# Patient Record
Sex: Male | Born: 1995 | Race: Asian | Hispanic: No | Marital: Single | Smoking: Never smoker
Health system: Southern US, Community
[De-identification: ages and names within clinical notes are randomized; demographics above are authoritative.]

---

## 2015-06-28 ENCOUNTER — Ambulatory Visit (INDEPENDENT_AMBULATORY_CARE_PROVIDER_SITE_OTHER): Payer: Managed Care, Other (non HMO)

## 2015-06-28 ENCOUNTER — Ambulatory Visit (INDEPENDENT_AMBULATORY_CARE_PROVIDER_SITE_OTHER): Payer: Managed Care, Other (non HMO) | Admitting: Family Medicine

## 2015-06-28 ENCOUNTER — Encounter: Payer: Self-pay | Admitting: Family Medicine

## 2015-06-28 VITALS — BP 116/76 | HR 87 | Temp 99.4°F | Resp 16 | Ht 65.0 in | Wt 131.0 lb

## 2015-06-28 DIAGNOSIS — Z111 Encounter for screening for respiratory tuberculosis: Secondary | ICD-10-CM

## 2015-06-28 DIAGNOSIS — Z119 Encounter for screening for infectious and parasitic diseases, unspecified: Secondary | ICD-10-CM

## 2015-06-28 DIAGNOSIS — Z23 Encounter for immunization: Secondary | ICD-10-CM

## 2015-06-28 NOTE — Patient Instructions (Addendum)
You got your flu shot today.  I will be in touch with your chest x-ray report and your varicella blood test (titer) If you wish, you can set up your mychart account and print your results out at home to save time!    Because you received an x-ray today, you will receive an invoice from [facility]. Please contact [facility] at [phone] with questions or concerns regarding your invoice. Our billing staff will not be able to assist you with those questions.

## 2015-06-28 NOTE — Progress Notes (Signed)
[facility] [address] [place] [address] [id number] [phone]  Date:  06/28/2015   Name:  Chad Zuniga   DOB:  [birth year]   MRN:  [id number]  PCP:  No PCP Per Patient    Chief Complaint: Immunizations; Flu Vaccine; and Other   History of Present Illness:  Chad Zuniga is a 20 y.o. 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   He is [place] and had all of his childhood vacs in [place].  He did not have the chicken pox as a child that he can recall Offered a PPD or CXR for his required PPD screening  He is generally in good health   There are no active problems to display for this patient.   History reviewed. No pertinent past medical history.  History reviewed. No pertinent past surgical history.  Social History  Substance Use Topics  . Smoking status: Never Smoker   . Smokeless tobacco: None  . Alcohol Use: None    History reviewed. No pertinent family history.  No Known Allergies  Medication list has been reviewed and updated.  No current outpatient prescriptions on file prior to visit.   No current facility-administered medications on file prior to visit.    Review of Systems:  As per HPI- otherwise negative.   Physical Examination: Filed Vitals:   06/28/15 1338  BP: 116/76  Pulse: 87  Temp: 99.4 F (37.4 C)  Resp: 16   Filed Vitals:   06/28/15 1338  Height:  (1.651 m)  Weight: 131 lb (59.421 kg)   Body mass index is 21.8 kg/(m^2). Ideal Body Weight: Weight in (lb) to have BMI = 25: 149.9  GEN: WDWN, NAD, Non-toxic, A & O x 3, looks well, slim build HEENT: Atraumatic, Normocephalic. Neck supple. No masses, No LAD. Ears and Nose: No external deformity. CV: RRR, No M/G/R. No JVD. No thrill. No extra heart sounds. PULM: CTA B, no wheezes, crackles, rhonchi. No retractions. No  resp. distress. No accessory muscle use. EXTR: No c/c/e NEURO Normal gait.  PSYCH: Normally interactive. Conversant. Not depressed or anxious appearing.  Calm demeanor.    Assessment and Plan: Screening examination for infectious disease - Plan: Varicella zoster antibody, IgG, DG Chest 1 View  Immunization due - Plan: Flu Vaccine QUAD 36+ mos IM  Screening for tuberculosis - Plan: DG Chest 1 View  Flu shot today, he prefers a CXR for TB screening as he does not have time to RTC for PPD read Varicella ab testing today  Signed Abbe Amsterdam, MD

## 2015-06-29 ENCOUNTER — Encounter: Payer: Self-pay | Admitting: Family Medicine

## 2015-06-29 LAB — VARICELLA ZOSTER ANTIBODY, IGG: VARICELLA IGG: 98.14 {index} (ref <135.00)

## 2015-07-04 ENCOUNTER — Encounter: Payer: Self-pay | Admitting: Family Medicine

## 2015-07-04 ENCOUNTER — Telehealth: Payer: Self-pay

## 2015-07-04 DIAGNOSIS — Z23 Encounter for immunization: Secondary | ICD-10-CM

## 2015-07-04 NOTE — Telephone Encounter (Signed)
Pt is looking for lab results   [phone]

## 2015-07-05 ENCOUNTER — Telehealth: Payer: Self-pay | Admitting: *Deleted

## 2015-07-05 MED ORDER — VARICELLA VIRUS VACCINE LIVE 1350 PFU/0.5ML IJ SUSR: 0.5000 mL | Freq: Once | INTRAMUSCULAR | Status: AC

## 2015-07-05 NOTE — Telephone Encounter (Signed)
Done

## 2015-07-05 NOTE — Telephone Encounter (Signed)
Pt.notified

## 2015-07-05 NOTE — Telephone Encounter (Signed)
From   Fanny Dance    To   Pearline Cables, MD    Sent   07/04/2015 12:50 PM        Hi Dr. Patsy Lager,  I have a Varicella titer done last week, & you left me a phone message that titer was negative. Can you please provide me a prescription of Varicella vaccine, and send it to [facility] in [place]: [address] [place], [place] [address]  [phone]. I called the pharmacy this morning and told them I will get my vaccine as soon as prescription is received..    Thank you so much!  P.S. Test result of my Varicella titer is not uploaded in [facility] records.         Can we help him?

## 2015-07-07 MED ORDER — VARICELLA VIRUS VACCINE LIVE 1350 PFU/0.5ML IJ SUSR: 0.5000 mL | Freq: Once | INTRAMUSCULAR | Status: AC

## 2015-07-07 NOTE — Addendum Note (Signed)
Addended by: Abbe Amsterdam C on: 07/07/2015 03:16 PM   Modules accepted: Orders

## 2017-05-06 IMAGING — CR DG CHEST 1V
1 series · 1 of 1 positions shown · non-contrast
Comparison: None.

CLINICAL DATA: Screening for infectious disease

EXAM:
CHEST 1 VIEW

[PA]
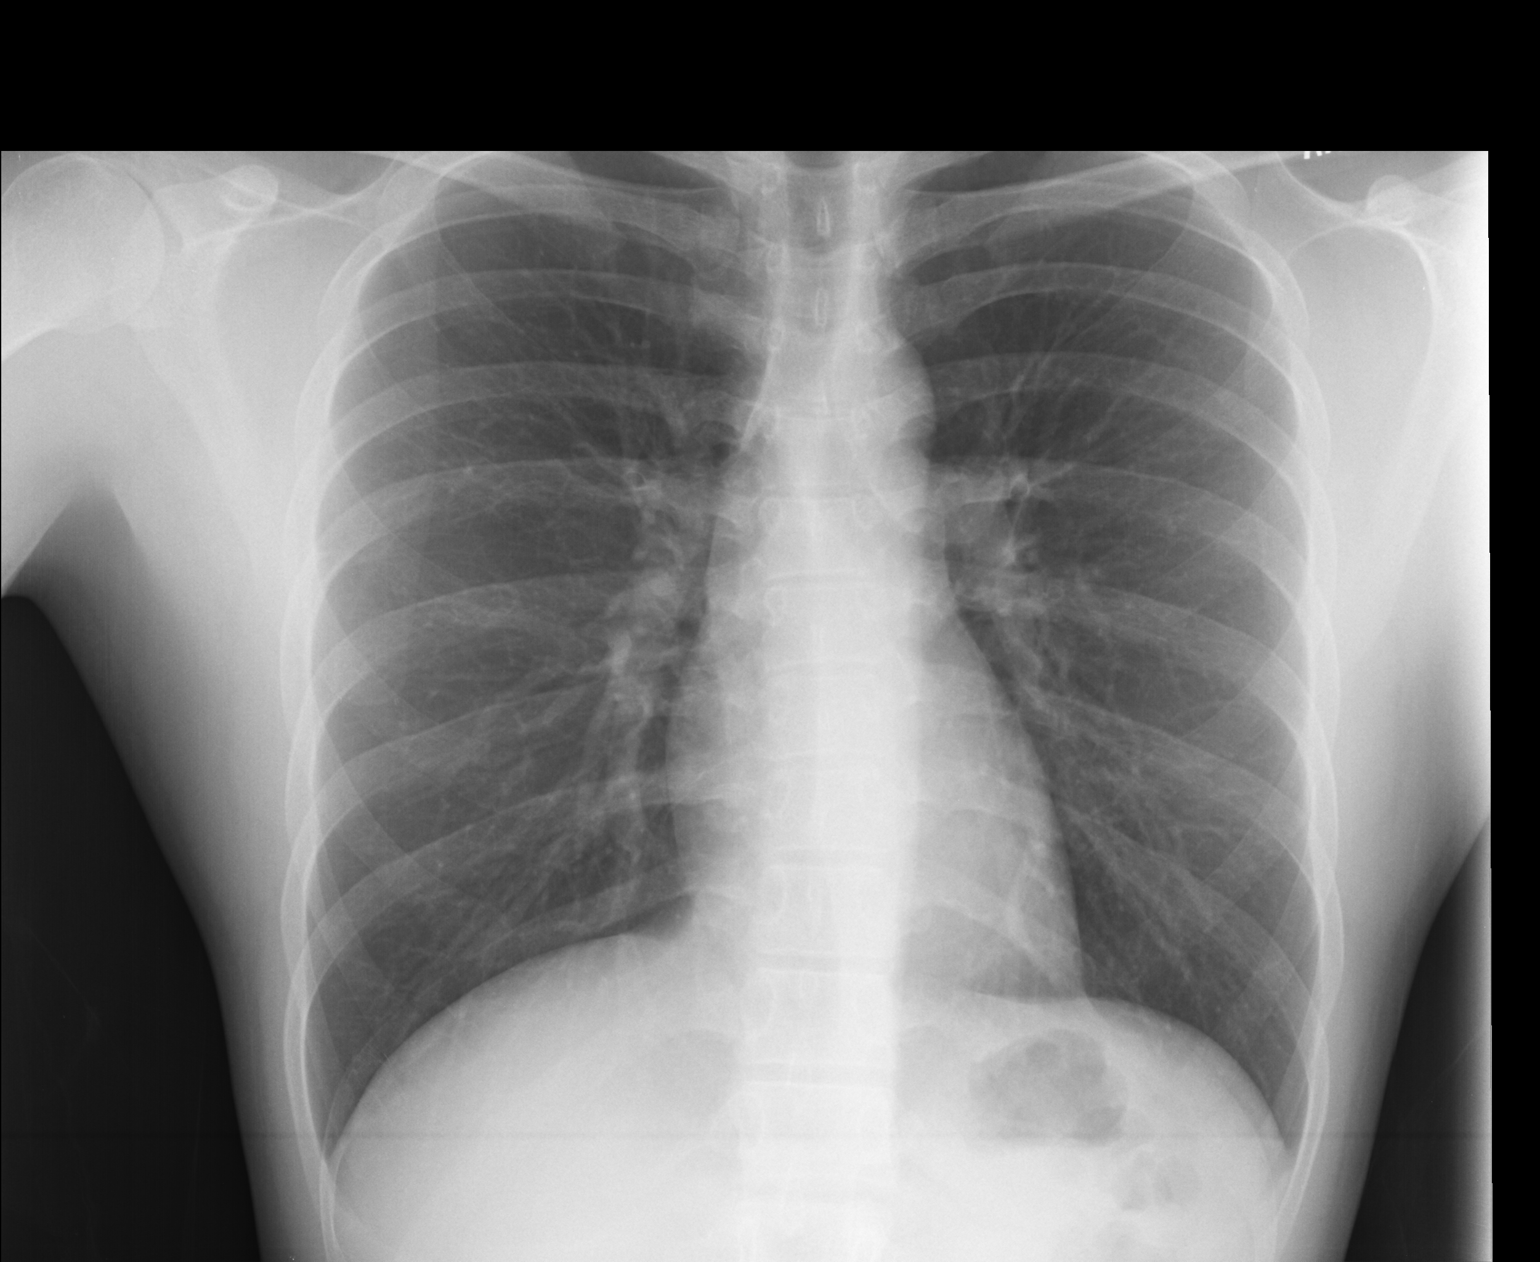

[1 of 1 positions shown; findings below may reference images not displayed]

FINDINGS: Cardiomediastinal silhouette is unremarkable. No acute infiltrate or
pleural effusion. No pulmonary edema. Bony thorax is unremarkable.
IMPRESSION: No active disease.
